# Patient Record
Sex: Male | Born: 1970 | Hispanic: Yes | Marital: Married | Smoking: Never smoker
Health system: Southern US, Community
[De-identification: ages and names within clinical notes are randomized; demographics above are authoritative.]

---

## 2015-10-14 ENCOUNTER — Emergency Department
Admission: EM | Admit: 2015-10-14 | Discharge: 2015-10-14 | Disposition: A | Discharge status: Home / Self Care | Payer: Self-pay | Attending: Emergency Medicine | Admitting: Emergency Medicine

## 2015-10-14 ENCOUNTER — Emergency Department: Payer: Self-pay

## 2015-10-14 ENCOUNTER — Encounter: Payer: Self-pay | Admitting: Emergency Medicine

## 2015-10-14 DIAGNOSIS — Y990 Civilian activity done for income or pay: Secondary | ICD-10-CM | POA: Insufficient documentation

## 2015-10-14 DIAGNOSIS — S199XXA Unspecified injury of neck, initial encounter: Secondary | ICD-10-CM | POA: Insufficient documentation

## 2015-10-14 DIAGNOSIS — W5522XA Struck by cow, initial encounter: Secondary | ICD-10-CM | POA: Insufficient documentation

## 2015-10-14 DIAGNOSIS — S20219A Contusion of unspecified front wall of thorax, initial encounter: Secondary | ICD-10-CM | POA: Insufficient documentation

## 2015-10-14 DIAGNOSIS — Y9289 Other specified places as the place of occurrence of the external cause: Secondary | ICD-10-CM | POA: Insufficient documentation

## 2015-10-14 DIAGNOSIS — W19XXXA Unspecified fall, initial encounter: Secondary | ICD-10-CM

## 2015-10-14 DIAGNOSIS — B699 Cysticercosis, unspecified: Secondary | ICD-10-CM | POA: Insufficient documentation

## 2015-10-14 DIAGNOSIS — Y9389 Activity, other specified: Secondary | ICD-10-CM | POA: Insufficient documentation

## 2015-10-14 MED ORDER — TRAMADOL HCL 50 MG PO TABS: 50.0000 mg | ORAL_TABLET | Freq: Four times a day (QID) | ORAL | Status: AC | PRN

## 2015-10-14 MED ORDER — TRAMADOL HCL 50 MG PO TABS
ORAL_TABLET | ORAL | Status: AC
  Filled 2015-10-14: qty 2

## 2015-10-14 MED ORDER — TRAMADOL HCL 50 MG PO TABS
100.0000 mg | ORAL_TABLET | Freq: Once | ORAL | Status: AC
  Administered 2015-10-14: 100 mg via ORAL

## 2015-10-14 NOTE — Discharge Instructions (Signed)
Contusión en el tórax   (Chest Contusion)   Una contusión es un hematoma profundo. Las contusiones ocurren cuando una lesión provoca un sangrado debajo de la piel. Los signos de hematoma son dolor, inflamación (hinchazón) y cambio de color en la piel. La zona de la contusión puede ponerse azul, morada o amarilla.   CUIDADOS EN EL HOGAR   · Aplique hielo sobre la zona lesionada.  ¨ Ponga el hielo en una bolsa plástica.  ¨ Colóquese una toalla entre la piel y la bolsa de hielo.  ¨ Deje el hielo durante 15 a 20 minutos por vez 3 a 4 veces por día, durante las primeras 48 horas.  · Sólo debe tomar la medicación según las indicaciones del médico.  · Haga reposo.  · Respire profundamente (ejercicios de respiración profunda) según lo indicado por su médico.  · Si fuma, abandone el hábito.  · No levante objetos de más de 5 libras (2.3 kg) durante 3 días o más si se lo indica su médico.  SOLICITE AYUDA DE INMEDIATO SI:   · Tiene más moretones o hinchazón.  · Siente un dolor que empeora.  · Tiene dificultad para respirar.  · Se siente mareado, débil o se desmaya (se desvanece).  · Observa sangre en el pis (orina) o en la materia fecal (heces).  · Tose o vomita sangre.  · La hinchazón o el dolor no se alivian con los medicamentos.  ASEGÚRESE DE QUE:   · Comprende estas instrucciones.  · Controlará su enfermedad.  · Solicitará ayuda de inmediato si no mejora o si empeora.     Esta información no tiene como fin reemplazar el consejo del médico. Asegúrese de hacerle al médico cualquier pregunta que tenga.     Document Released: 08/15/2012  Elsevier Interactive Patient Education ©2016 [employer]

## 2015-10-14 NOTE — ED Notes (Signed)
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 States is having chest pain and pain when breathing. Bruising noted to center of chest. No acute resp distress noted.  Interpreter present in triage.

## 2015-10-14 NOTE — ED Notes (Signed)
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

## 2015-10-14 NOTE — ED Provider Notes (Signed)
[facility] Emergency Department Provider Note  Time seen: 2:51 PM  I have reviewed the triage vital signs and the nursing notes.   HISTORY  Chief Complaint Chest Injury  hospital interpreter used for this examination.   HPI Bradley Villanueva is a 44 y.o. male with no past medical history who presents the emergency department after an injury. 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 He states it knocked him backwards and he fell to the ground hitting the back of his head. States he may have lost consciousness briefly. His only complaint is mild chest pain, and mild neck pain. Denies nausea or vomiting. Patient states he was able to go to his second job and work today but his chest pain started hurting him so he came to the emergency department for evaluation. Denies any shortness of breath. States his chest pain is mild to moderate. Neck pain is mild. Denies any focal weakness or numbness.    History reviewed. No pertinent past medical history.  There are no active problems to display for this patient.   History reviewed. No pertinent past surgical history.  No current outpatient prescriptions on file.  Allergies Review of patient's allergies indicates no known allergies.  No family history on file.  Social History Social History  Substance Use Topics  . Smoking status: Never Smoker   . Smokeless tobacco: None  . Alcohol Use: No    Review of Systems Constitutional: Negative for fever. Cardiovascular: Positive for chest wall pain. Respiratory: Negative for shortness of breath. Gastrointestinal: Negative for abdominal pain Musculoskeletal: Chest pain and neck pain. Neurological: Negative for headaches, focal weakness or numbness. 10-point ROS otherwise negative.  ____________________________________________   PHYSICAL EXAM:  VITAL SIGNS: ED Triage  Vitals  Enc Vitals Group     BP 10/14/15 1145 158/90 mmHg     Pulse Rate 10/14/15 1145 78     Resp 10/14/15 1145 20     Temp 10/14/15 1145 98.6 F (37 C)     Temp Source 10/14/15 1145 Oral     SpO2 10/14/15 1145 100 %     Weight 10/14/15 1145 172 lb (78.019 kg)     Height --      Head Cir --      Peak Flow --      Pain Score 10/14/15 1145 7     Pain Loc --      Pain Edu? --      Excl. in GC? --     Constitutional: Alert and oriented. Well appearing and in no distress. Eyes: Normal exam ENT   Head: Normocephalic and atraumatic. No hematoma noted. Neck has mild cervical spine tenderness to palpation.   Mouth/Throat: Mucous membranes are moist. Cardiovascular: Normal rate, regular rhythm. No murmurs, rubs, or gallops. Respiratory: Normal respiratory effort without tachypnea nor retractions. Breath sounds are clear and equal bilaterally. No wheezes/rales/rhonchi. Mild abrasion to central chest with mild tenderness to palpation over this area. Gastrointestinal: Soft and nontender. No distention.  Benign abdominal exam. Musculoskeletal: Nontender with normal range of motion in all extremities. Atraumatic extremities. Neurologic:  No gross focal neurologic deficits Skin:  Skin is warm, dry and intact.  Psychiatric: Mood and affect are normal.  ____________________________________________    EKG  EKG reviewed and interpreted by myself shows normal sinus rhythm at 67 bpm, narrow QRS, normal axis, normal intervals, no  ST changes noted. Normal EKG.  ____________________________________________    RADIOLOGY  Chest x-ray shows no acute abnormality  CT head shows calcified lesion. CT cervical spine shows no acute abnormality.   INITIAL IMPRESSION / ASSESSMENT AND PLAN / ED COURSE  Pertinent labs & imaging results that were available during my care of the patient were reviewed by me and considered in my medical decision making (see chart for details).  Patient presents  after chest impact with a cows head, falling backwards hitting his head with possible LOC. Patient's only complaint is mild neck pain, mild chest pain. Patient has mild chest wall tenderness to palpation with mild abrasions noted to the center of the sternum. EKG appears well. Chest x-ray is negative. We'll obtain a CT head and C-spine given his pain complaints and possible LOC.  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 I discussed the patient with neurology, they state likely an incidental finding with no seizures, etc. the patient does not require any further treatment. I discussed this with the patient, he states he moved from [place] 22 years ago. This is likely a chronic calcification. I discussed with the patient follow up with a primary care physician. . ____________________________________________   FINAL CLINICAL IMPRESSION(S) / ED DIAGNOSES  fall Chest contusion Cysticercosis   Minna Antis, MD 10/14/15 [id number]

## 2016-05-01 IMAGING — CT CT HEAD W/O CM
2 of 3 series · 15 of 30 positions shown, 19 images · non-contrast
Comparison: None.

CLINICAL DATA: Kicked in chest by bull. Loss of consciousness after
fall.

EXAM:
CT HEAD WITHOUT CONTRAST
CT CERVICAL SPINE WITHOUT CONTRAST
TECHNIQUE: Multidetector CT imaging of the head and cervical spine was
performed following the standard protocol without intravenous
contrast. Multiplanar CT image reconstructions of the cervical spine
were also generated.

[Series 2: head wo · axial · 0.39mm/px · z∈[-119,-65]mm · 2 of 36 slices shown]
[im 12/36  brain]
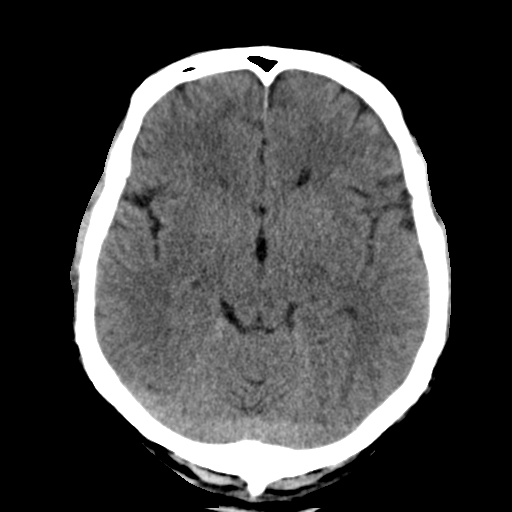
[im 24/36  brain]
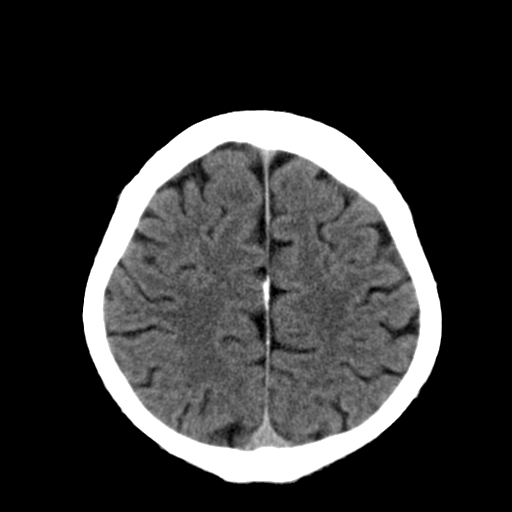

[Series 8: orthogonal axials · axial · 0.29mm/px · z∈[-378,-186]mm · 13 of 123 slices shown, 17 images]
[im 9/123  brain]
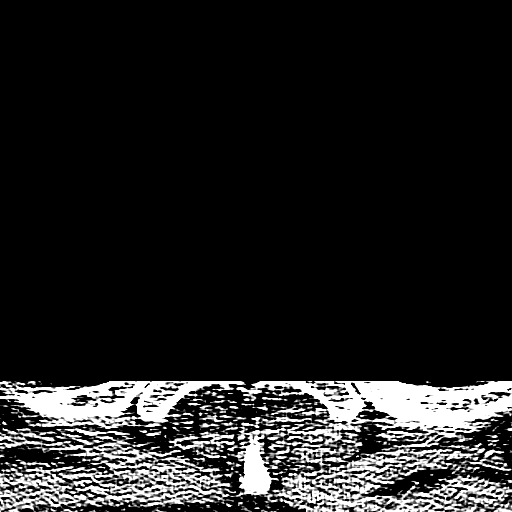
[im 9/123  bone]
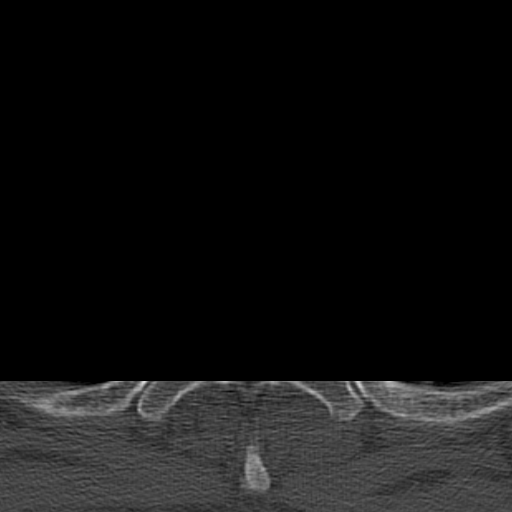
[im 18/123  brain]
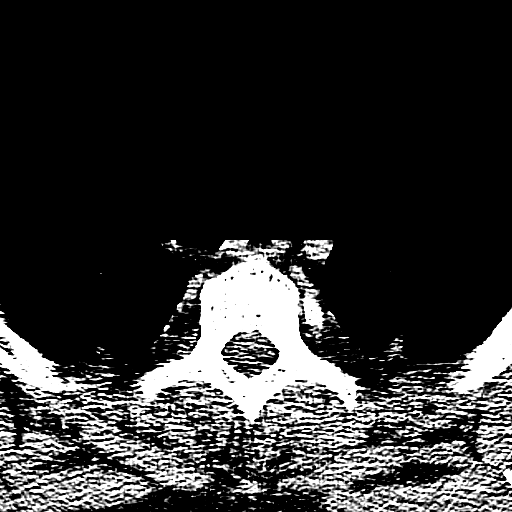
[im 27/123  brain]
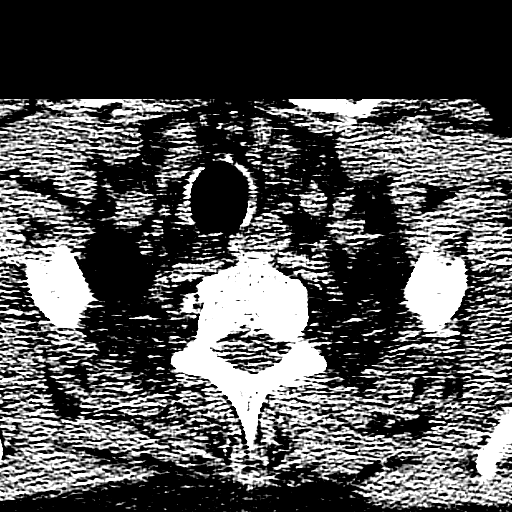
[im 35/123  brain]
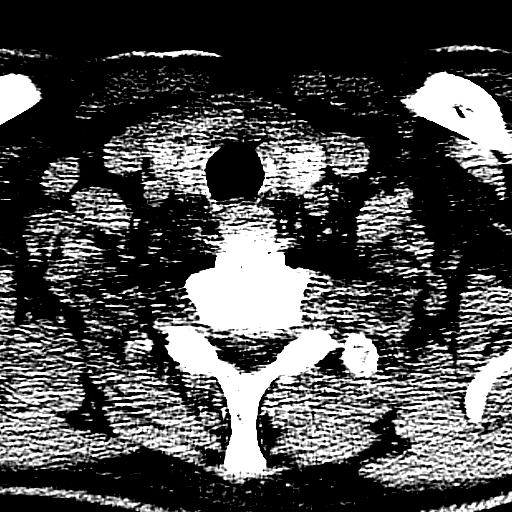
[im 44/123  brain]
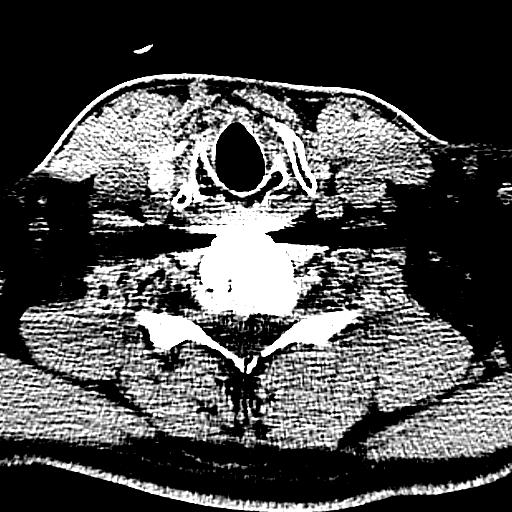
[im 44/123  bone]
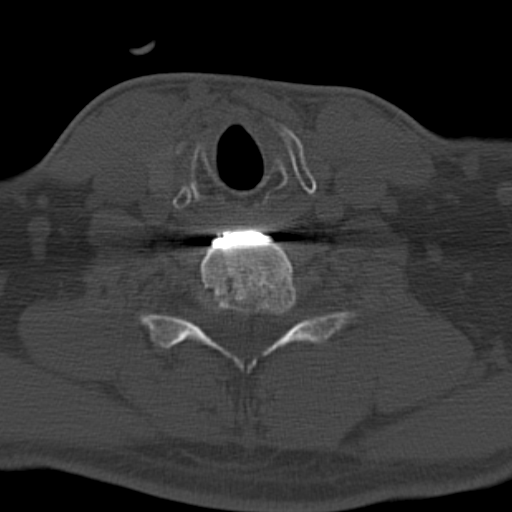
[im 53/123  brain]
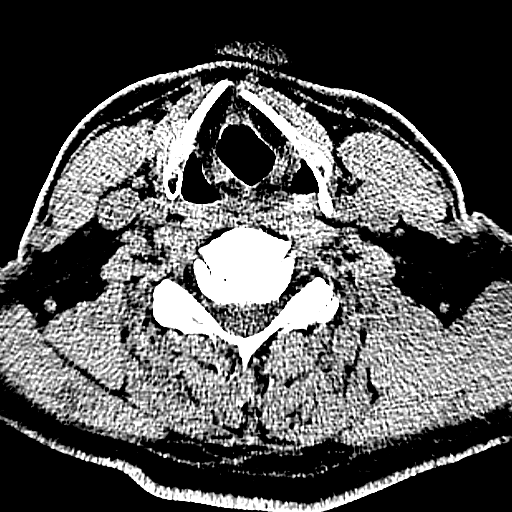
[im 61/123  brain]
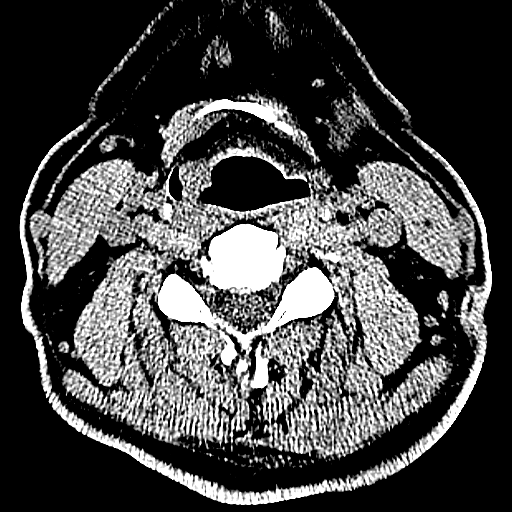
[im 70/123  brain]
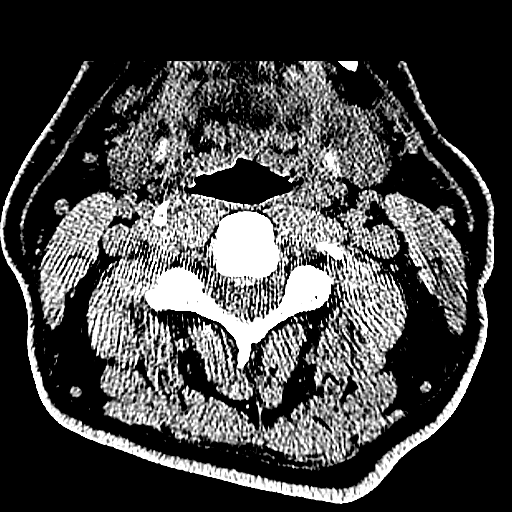
[im 79/123  brain]
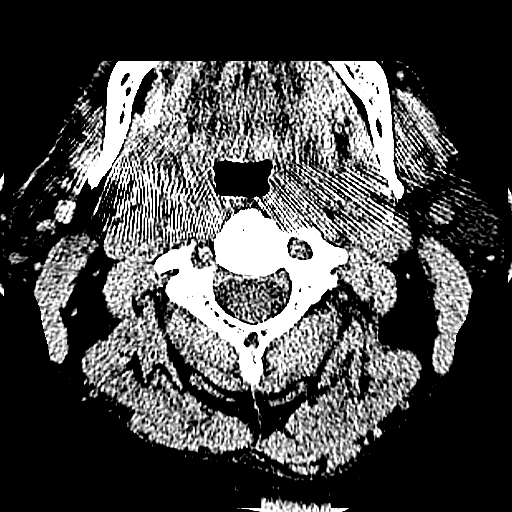
[im 79/123  bone]
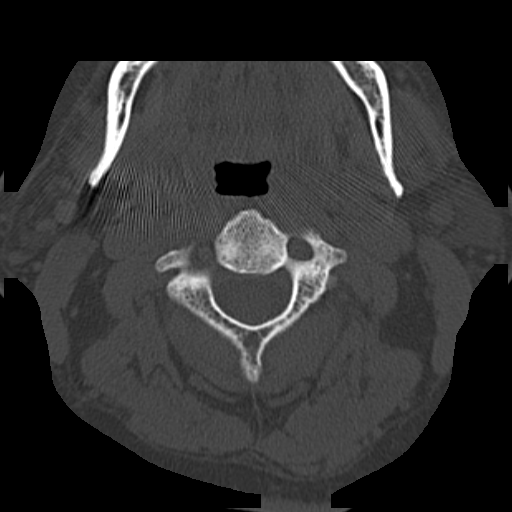
[im 88/123  brain]
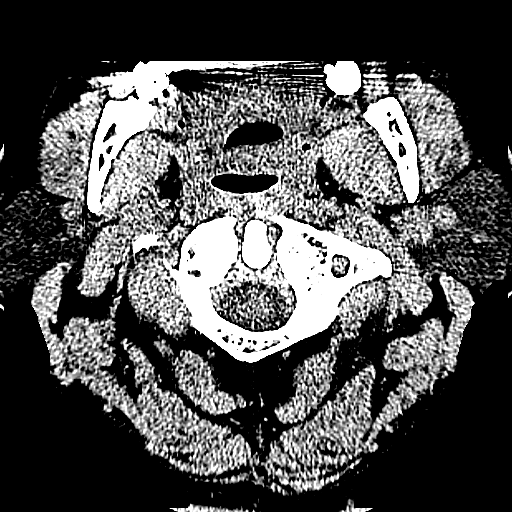
[im 96/123  brain]
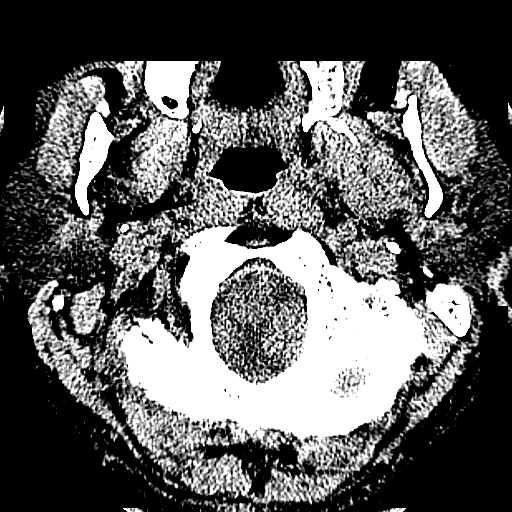
[im 105/123  brain]
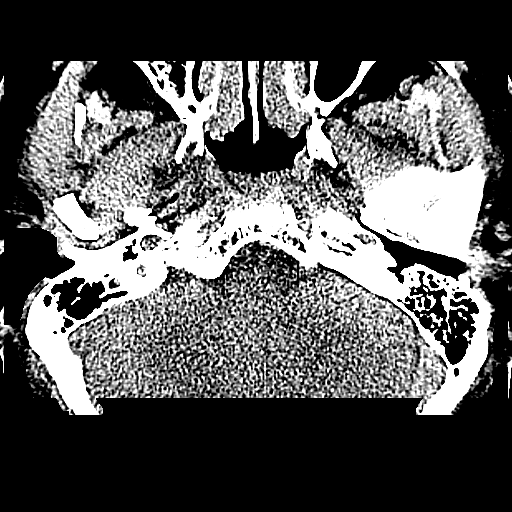
[im 114/123  brain]
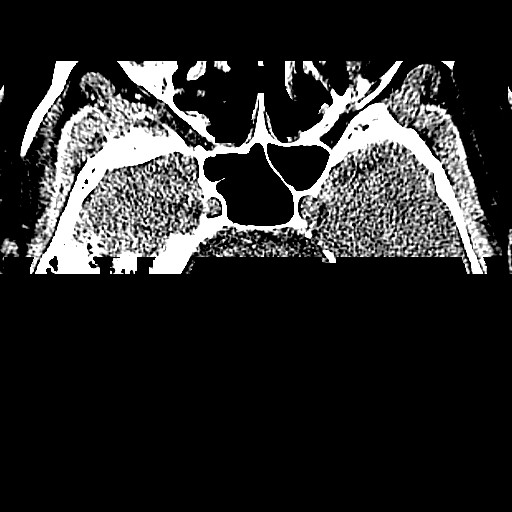
[im 114/123  bone]
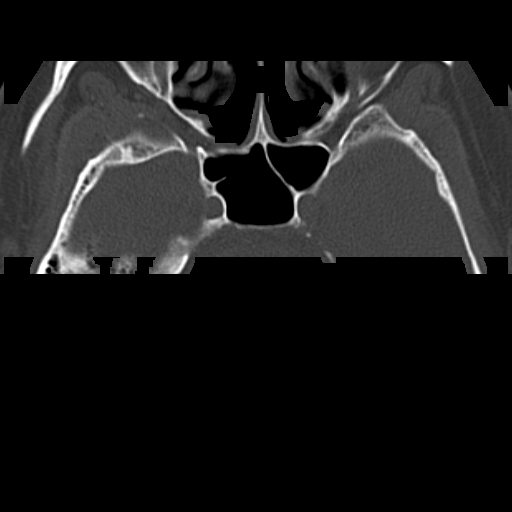

[15 of 30 positions shown; findings below may reference images not displayed]

FINDINGS: CT HEAD FINDINGS

Ventricle size normal.  Cerebral volume normal.

10 mm curvilinear density in the left parietal cortex. This appears
to be calcification rather than acute blood. Given the size and
location in shape, this may represent calcification from prior
infection such as cysticercosis. No surrounding edema. No subdural
hematoma. Negative for mass. No acute infarct.

Negative for skull fracture.

CT CERVICAL SPINE FINDINGS

ACDF with solid fusion C6-7. Anterior plate and screws in good
position. Mild disc degeneration and spurring at C5-6.

Negative for fracture.
IMPRESSION: 10 mm curvilinear density in the left parietal lobe. Favor
calcification from cysticercosis. Acute hemorrhage is not considered
likely given the appearance however if the patient had significant
head injury, follow-up CT in 1 in 2 weeks may be helpful for further
evaluation

ACDF at C6-7.  Negative for cervical spine fracture.

## 2016-05-01 IMAGING — CR DG CHEST 2V
1 series · 2 of 2 positions shown · non-contrast
Comparison: None.

CLINICAL DATA: Chest pain after being kicked in the chest by a 0866
pound bull this morning.

EXAM:
CHEST  2 VIEW

[Series 1: dg chest 2 view · 0.14mm/px · 2 of 2 slices shown]
[im 1/2]
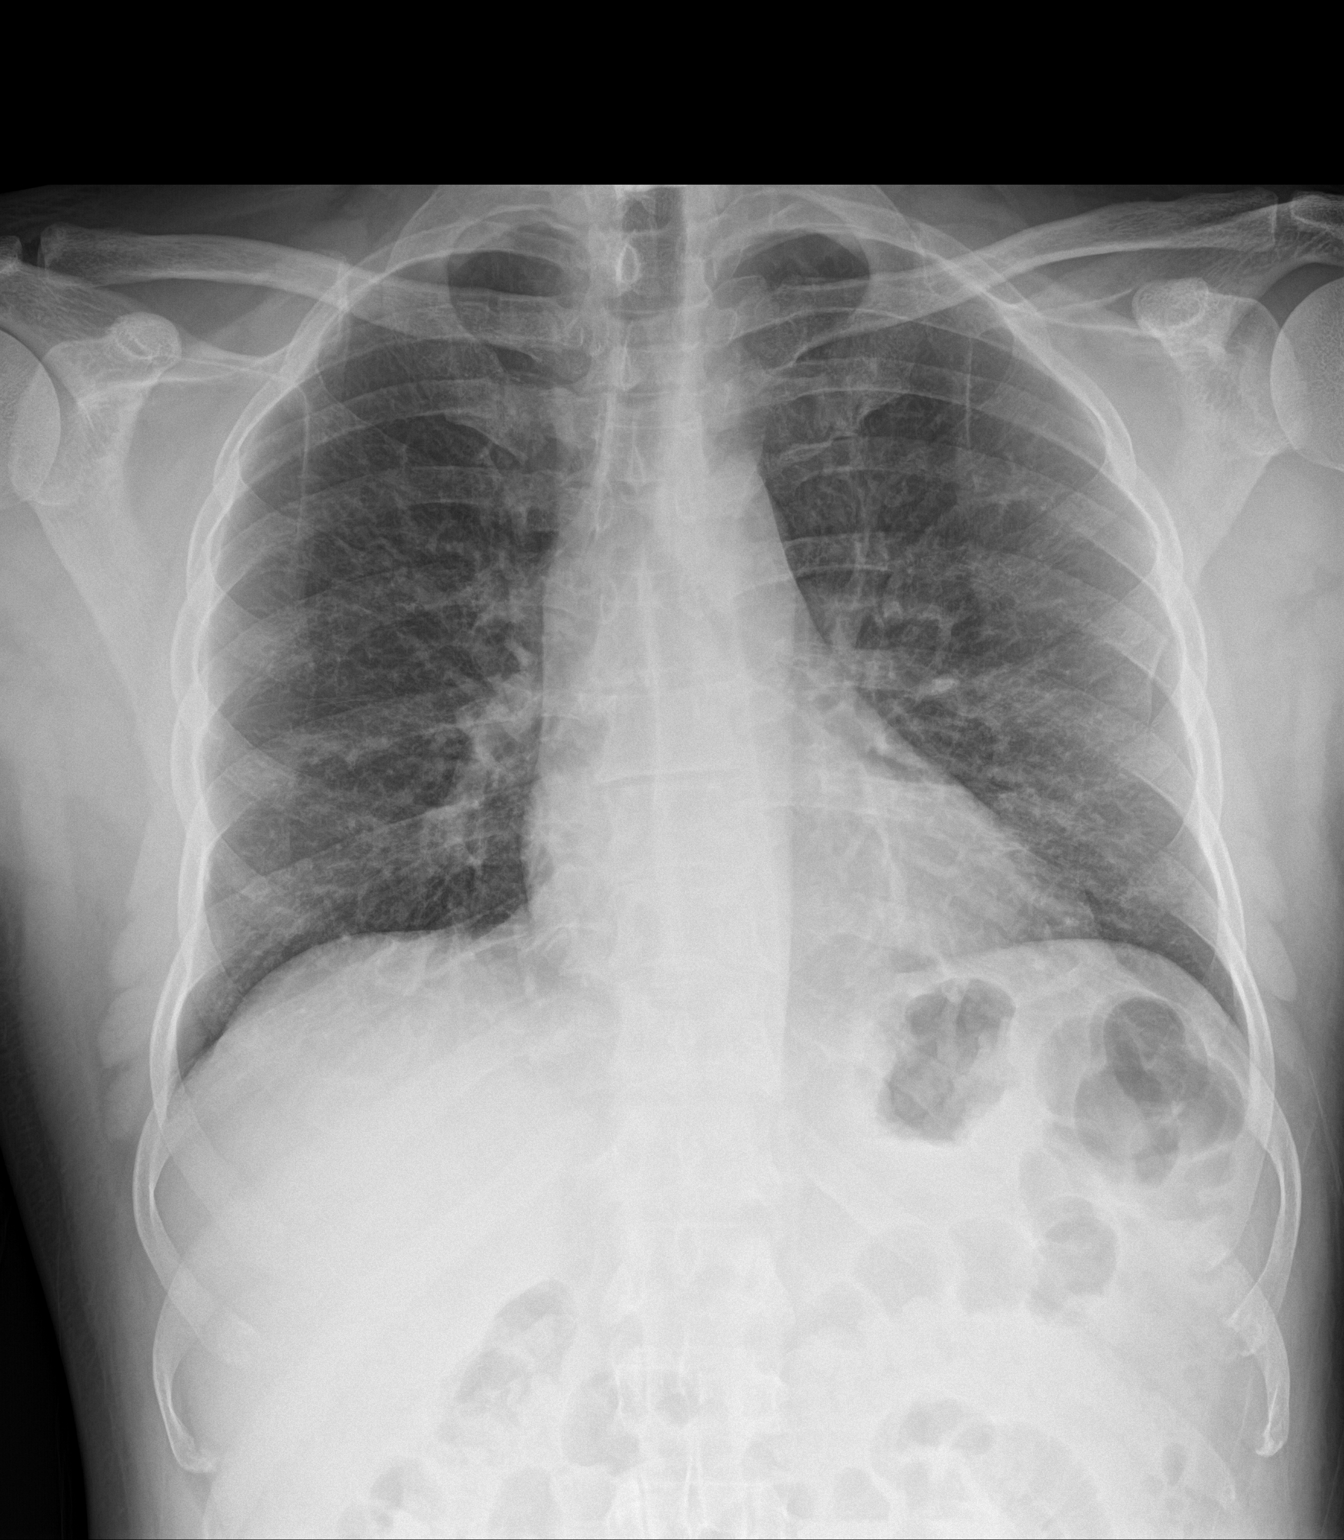
[im 2/2]
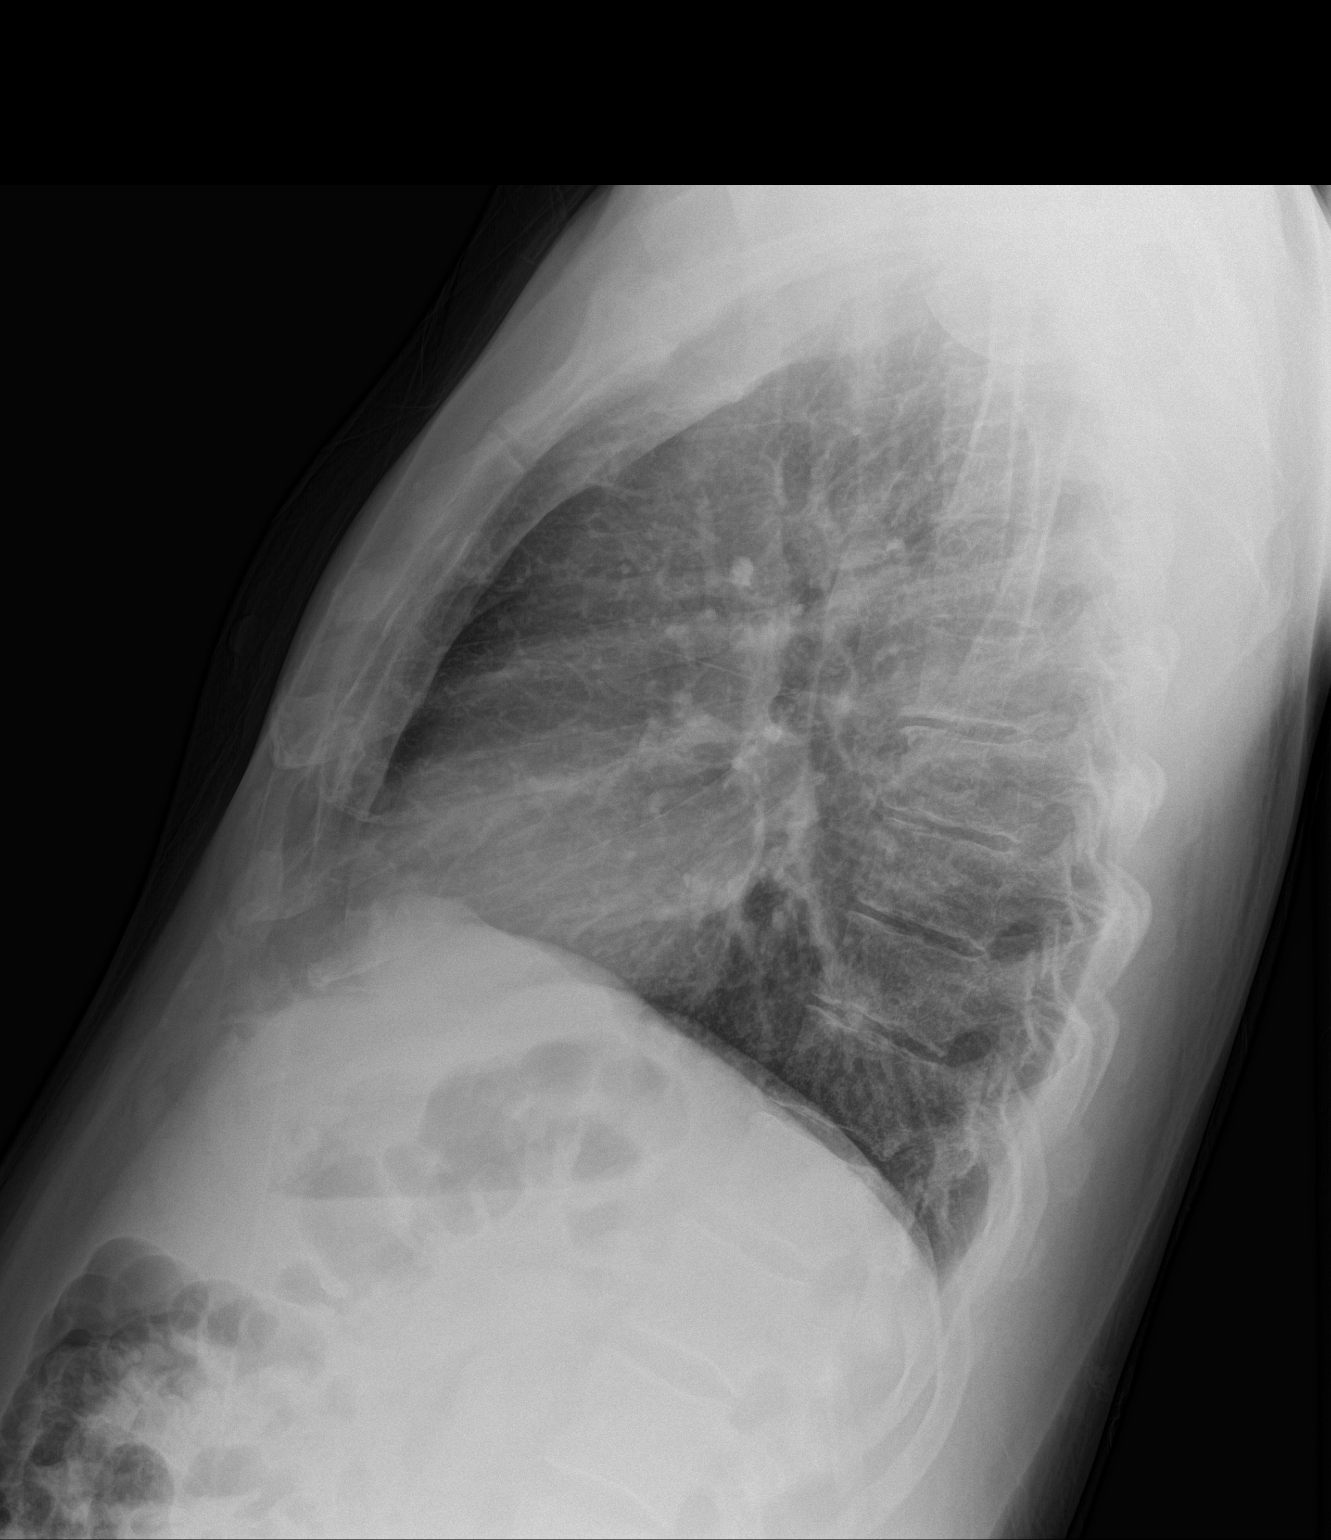

[2 of 2 positions shown; findings below may reference images not displayed]

FINDINGS: The heart size and mediastinal contours are within normal limits.
Both lungs are clear. The visualized skeletal structures are
unremarkable.
IMPRESSION: No acute abnormalities.

## 2019-10-26 ENCOUNTER — Other Ambulatory Visit: Payer: Self-pay

## 2019-10-26 DIAGNOSIS — Z20822 Contact with and (suspected) exposure to covid-19: Secondary | ICD-10-CM

## 2019-10-28 LAB — NOVEL CORONAVIRUS, NAA: SARS-CoV-2, NAA: NOT DETECTED
